# Patient Record
Sex: Female | Born: 1965 | Race: White | Hispanic: No | Smoking: Never smoker
Health system: Southern US, Community
[De-identification: ages and names within clinical notes are randomized; demographics above are authoritative.]

## PROBLEM LIST (undated history)

## (undated) DIAGNOSIS — I739 Peripheral vascular disease, unspecified: Secondary | ICD-10-CM

## (undated) DIAGNOSIS — I8390 Asymptomatic varicose veins of unspecified lower extremity: Secondary | ICD-10-CM

## (undated) HISTORY — PX: HERNIA REPAIR: SHX51

## (undated) HISTORY — PX: VASCULAR SURGERY: SHX849

## (undated) HISTORY — PX: TUBAL LIGATION: SHX77

## (undated) HISTORY — PX: BLADDER SURGERY: SHX569

## (undated) HISTORY — PX: DILATION AND CURETTAGE OF UTERUS: SHX78

## (undated) HISTORY — PX: TONSILLECTOMY: SUR1361

---

## 2019-01-27 ENCOUNTER — Other Ambulatory Visit (HOSPITAL_COMMUNITY): Payer: Self-pay | Admitting: Orthopedic Surgery

## 2019-02-06 ENCOUNTER — Other Ambulatory Visit: Payer: Self-pay

## 2019-02-06 ENCOUNTER — Encounter (HOSPITAL_BASED_OUTPATIENT_CLINIC_OR_DEPARTMENT_OTHER): Payer: Self-pay | Admitting: *Deleted

## 2019-02-10 ENCOUNTER — Other Ambulatory Visit (HOSPITAL_COMMUNITY): Admission: RE | Admit: 2019-02-10 | Payer: PRIVATE HEALTH INSURANCE | Source: Ambulatory Visit

## 2019-02-11 ENCOUNTER — Other Ambulatory Visit: Payer: Self-pay

## 2019-02-11 ENCOUNTER — Other Ambulatory Visit (HOSPITAL_COMMUNITY)
Admission: RE | Admit: 2019-02-11 | Discharge: 2019-02-11 | Disposition: A | Discharge status: Home / Self Care | Payer: No Typology Code available for payment source | Source: Ambulatory Visit | Attending: Orthopedic Surgery | Admitting: Orthopedic Surgery

## 2019-02-11 DIAGNOSIS — Z1159 Encounter for screening for other viral diseases: Secondary | ICD-10-CM | POA: Diagnosis present

## 2019-02-11 LAB — SARS CORONAVIRUS 2 BY RT PCR (HOSPITAL ORDER, PERFORMED IN ~~LOC~~ HOSPITAL LAB): SARS Coronavirus 2: NEGATIVE

## 2019-02-11 NOTE — Progress Notes (Signed)
Ensure pre surgery drink given with instructions to complete by 0530 dos, surgical soap given with instructions, pt verbalized understanding. 

## 2019-02-13 ENCOUNTER — Encounter (HOSPITAL_BASED_OUTPATIENT_CLINIC_OR_DEPARTMENT_OTHER)
Admission: RE | Disposition: A | Discharge status: Home / Self Care | Payer: Self-pay | Source: Home / Self Care | Attending: Orthopedic Surgery

## 2019-02-13 ENCOUNTER — Encounter (HOSPITAL_BASED_OUTPATIENT_CLINIC_OR_DEPARTMENT_OTHER): Payer: Self-pay

## 2019-02-13 ENCOUNTER — Other Ambulatory Visit: Payer: Self-pay

## 2019-02-13 ENCOUNTER — Ambulatory Visit (HOSPITAL_BASED_OUTPATIENT_CLINIC_OR_DEPARTMENT_OTHER): Payer: No Typology Code available for payment source | Admitting: Certified Registered"

## 2019-02-13 ENCOUNTER — Ambulatory Visit (HOSPITAL_COMMUNITY): Payer: No Typology Code available for payment source

## 2019-02-13 ENCOUNTER — Ambulatory Visit (HOSPITAL_BASED_OUTPATIENT_CLINIC_OR_DEPARTMENT_OTHER)
Admission: RE | Admit: 2019-02-13 | Discharge: 2019-02-13 | Disposition: A | Discharge status: Home / Self Care | Payer: No Typology Code available for payment source | Attending: Orthopedic Surgery | Admitting: Orthopedic Surgery

## 2019-02-13 DIAGNOSIS — I739 Peripheral vascular disease, unspecified: Secondary | ICD-10-CM | POA: Insufficient documentation

## 2019-02-13 DIAGNOSIS — M21612 Bunion of left foot: Secondary | ICD-10-CM | POA: Insufficient documentation

## 2019-02-13 DIAGNOSIS — Z1159 Encounter for screening for other viral diseases: Secondary | ICD-10-CM | POA: Insufficient documentation

## 2019-02-13 DIAGNOSIS — M71372 Other bursal cyst, left ankle and foot: Secondary | ICD-10-CM | POA: Insufficient documentation

## 2019-02-13 DIAGNOSIS — M67472 Ganglion, left ankle and foot: Secondary | ICD-10-CM | POA: Insufficient documentation

## 2019-02-13 HISTORY — PX: TARSAL METATARSAL ARTHRODESIS: SHX2481

## 2019-02-13 HISTORY — PX: BUNIONECTOMY: SHX129

## 2019-02-13 HISTORY — DX: Asymptomatic varicose veins of unspecified lower extremity: I83.90

## 2019-02-13 HISTORY — DX: Peripheral vascular disease, unspecified: I73.9

## 2019-02-13 LAB — SARS CORONAVIRUS 2 BY RT PCR (HOSPITAL ORDER, PERFORMED IN ~~LOC~~ HOSPITAL LAB): SARS Coronavirus 2: NEGATIVE

## 2019-02-13 SURGERY — BUNIONECTOMY
Anesthesia: General | Site: Foot | Laterality: Left

## 2019-02-13 MED ORDER — FENTANYL CITRATE (PF) 100 MCG/2ML IJ SOLN
INTRAMUSCULAR | Status: AC
  Filled 2019-02-13: qty 2

## 2019-02-13 MED ORDER — MIDAZOLAM HCL 2 MG/2ML IJ SOLN
INTRAMUSCULAR | Status: AC
  Filled 2019-02-13: qty 2

## 2019-02-13 MED ORDER — CHLORHEXIDINE GLUCONATE 4 % EX LIQD: 60.0000 mL | Freq: Once | CUTANEOUS | Status: DC

## 2019-02-13 MED ORDER — LACTATED RINGERS IV SOLN
INTRAVENOUS | Status: DC
  Administered 2019-02-13: 11:00:00 via INTRAVENOUS

## 2019-02-13 MED ORDER — FENTANYL CITRATE (PF) 100 MCG/2ML IJ SOLN
50.0000 ug | INTRAMUSCULAR | Status: DC | PRN
  Administered 2019-02-13: 100 ug via INTRAVENOUS

## 2019-02-13 MED ORDER — MIDAZOLAM HCL 2 MG/2ML IJ SOLN
1.0000 mg | INTRAMUSCULAR | Status: DC | PRN
  Administered 2019-02-13: 2 mg via INTRAVENOUS

## 2019-02-13 MED ORDER — DEXAMETHASONE SODIUM PHOSPHATE 10 MG/ML IJ SOLN
INTRAMUSCULAR | Status: DC | PRN
  Administered 2019-02-13: 10 mg via INTRAVENOUS

## 2019-02-13 MED ORDER — PROPOFOL 10 MG/ML IV BOLUS
INTRAVENOUS | Status: DC | PRN
  Administered 2019-02-13: 180 mg via INTRAVENOUS

## 2019-02-13 MED ORDER — BUPIVACAINE-EPINEPHRINE (PF) 0.5% -1:200000 IJ SOLN
INTRAMUSCULAR | Status: AC
  Filled 2019-02-13: qty 30

## 2019-02-13 MED ORDER — CEFAZOLIN SODIUM-DEXTROSE 2-4 GM/100ML-% IV SOLN
2.0000 g | INTRAVENOUS | Status: AC
  Administered 2019-02-13: 2 g via INTRAVENOUS

## 2019-02-13 MED ORDER — 0.9 % SODIUM CHLORIDE (POUR BTL) OPTIME
TOPICAL | Status: DC | PRN
  Administered 2019-02-13: 150 mL

## 2019-02-13 MED ORDER — OXYCODONE HCL 5 MG PO TABS: 5.0000 mg | ORAL_TABLET | Freq: Four times a day (QID) | ORAL | 0 refills | Status: AC | PRN

## 2019-02-13 MED ORDER — ROPIVACAINE HCL 5 MG/ML IJ SOLN
INTRAMUSCULAR | Status: DC | PRN
  Administered 2019-02-13: 30 mL via PERINEURAL

## 2019-02-13 MED ORDER — HYDROMORPHONE HCL 1 MG/ML IJ SOLN: 0.2500 mg | INTRAMUSCULAR | Status: DC | PRN

## 2019-02-13 MED ORDER — ASPIRIN EC 81 MG PO TBEC: 81.0000 mg | DELAYED_RELEASE_TABLET | Freq: Two times a day (BID) | ORAL | 0 refills | Status: AC

## 2019-02-13 MED ORDER — ONDANSETRON HCL 4 MG/2ML IJ SOLN
INTRAMUSCULAR | Status: DC | PRN
  Administered 2019-02-13: 4 mg via INTRAVENOUS

## 2019-02-13 MED ORDER — PROMETHAZINE HCL 25 MG/ML IJ SOLN: 6.2500 mg | INTRAMUSCULAR | Status: DC | PRN

## 2019-02-13 MED ORDER — MEPERIDINE HCL 25 MG/ML IJ SOLN: 6.2500 mg | INTRAMUSCULAR | Status: DC | PRN

## 2019-02-13 MED ORDER — SCOPOLAMINE 1 MG/3DAYS TD PT72: 1.0000 | MEDICATED_PATCH | Freq: Once | TRANSDERMAL | Status: DC | PRN

## 2019-02-13 MED ORDER — OXYCODONE HCL 5 MG/5ML PO SOLN: 5.0000 mg | Freq: Once | ORAL | Status: DC | PRN

## 2019-02-13 MED ORDER — OXYCODONE HCL 5 MG PO TABS: 5.0000 mg | ORAL_TABLET | Freq: Once | ORAL | Status: DC | PRN

## 2019-02-13 MED ORDER — SODIUM CHLORIDE 0.9 % IV SOLN: INTRAVENOUS | Status: DC

## 2019-02-13 MED ORDER — NAPROXEN SODIUM 220 MG PO TABS: 440.0000 mg | ORAL_TABLET | Freq: Two times a day (BID) | ORAL | Status: AC

## 2019-02-13 MED ORDER — PROPOFOL 500 MG/50ML IV EMUL
INTRAVENOUS | Status: DC | PRN
  Administered 2019-02-13: 25 ug/kg/min via INTRAVENOUS

## 2019-02-13 MED ORDER — LIDOCAINE 2% (20 MG/ML) 5 ML SYRINGE
INTRAMUSCULAR | Status: DC | PRN
  Administered 2019-02-13: 30 mg via INTRAVENOUS

## 2019-02-13 MED ORDER — CEFAZOLIN SODIUM-DEXTROSE 2-4 GM/100ML-% IV SOLN
INTRAVENOUS | Status: AC
  Filled 2019-02-13: qty 100

## 2019-02-13 SURGICAL SUPPLY — 78 items
BANDAGE ESMARK 6X9 LF (GAUZE/BANDAGES/DRESSINGS) IMPLANT
BIT DRILL 2.4 AO COUPLING CANN (BIT) ×2 IMPLANT
BLADE AVERAGE 25X9 (BLADE) IMPLANT
BLADE LONG MED 25X9 (BLADE) IMPLANT
BLADE MICRO SAGITTAL (BLADE) ×2 IMPLANT
BLADE MINI RND TIP GREEN BEAV (BLADE) IMPLANT
BLADE OSC/SAG .038X5.5 CUT EDG (BLADE) IMPLANT
BLADE SURG 15 STRL LF DISP TIS (BLADE) ×3 IMPLANT
BLADE SURG 15 STRL SS (BLADE) ×3
BNDG COHESIVE 4X5 TAN STRL (GAUZE/BANDAGES/DRESSINGS) ×2 IMPLANT
BNDG COHESIVE 6X5 TAN STRL LF (GAUZE/BANDAGES/DRESSINGS) ×2 IMPLANT
BNDG CONFORM 2 STRL LF (GAUZE/BANDAGES/DRESSINGS) IMPLANT
BNDG CONFORM 3 STRL LF (GAUZE/BANDAGES/DRESSINGS) ×2 IMPLANT
BNDG ESMARK 6X9 LF (GAUZE/BANDAGES/DRESSINGS)
CHLORAPREP W/TINT 26 (MISCELLANEOUS) ×2 IMPLANT
COVER BACK TABLE REUSABLE LG (DRAPES) ×2 IMPLANT
COVER WAND RF STERILE (DRAPES) IMPLANT
CUFF TOURN SGL QUICK 34 (TOURNIQUET CUFF) ×1
CUFF TRNQT CYL 34X4.125X (TOURNIQUET CUFF) ×1 IMPLANT
DECANTER SPIKE VIAL GLASS SM (MISCELLANEOUS) IMPLANT
DRAPE EXTREMITY T 121X128X90 (DISPOSABLE) ×2 IMPLANT
DRAPE OEC MINIVIEW 54X84 (DRAPES) ×2 IMPLANT
DRAPE U-SHAPE 47X51 STRL (DRAPES) ×2 IMPLANT
DRSG MEPITEL 4X7.2 (GAUZE/BANDAGES/DRESSINGS) ×2 IMPLANT
DRSG PAD ABDOMINAL 8X10 ST (GAUZE/BANDAGES/DRESSINGS) ×4 IMPLANT
ELECT REM PT RETURN 9FT ADLT (ELECTROSURGICAL) ×2
ELECTRODE REM PT RTRN 9FT ADLT (ELECTROSURGICAL) ×1 IMPLANT
GAUZE SPONGE 4X4 12PLY STRL (GAUZE/BANDAGES/DRESSINGS) ×2 IMPLANT
GLOVE BIO SURGEON STRL SZ7 (GLOVE) ×4 IMPLANT
GLOVE BIO SURGEON STRL SZ8 (GLOVE) ×2 IMPLANT
GLOVE BIOGEL PI IND STRL 7.0 (GLOVE) ×2 IMPLANT
GLOVE BIOGEL PI IND STRL 7.5 (GLOVE) ×1 IMPLANT
GLOVE BIOGEL PI IND STRL 8 (GLOVE) ×1 IMPLANT
GLOVE BIOGEL PI INDICATOR 7.0 (GLOVE) ×2
GLOVE BIOGEL PI INDICATOR 7.5 (GLOVE) ×1
GLOVE BIOGEL PI INDICATOR 8 (GLOVE) ×1
GLOVE ECLIPSE 8.0 STRL XLNG CF (GLOVE) IMPLANT
GOWN STRL REUS W/ TWL LRG LVL3 (GOWN DISPOSABLE) ×1 IMPLANT
GOWN STRL REUS W/ TWL XL LVL3 (GOWN DISPOSABLE) ×2 IMPLANT
GOWN STRL REUS W/TWL LRG LVL3 (GOWN DISPOSABLE) ×1
GOWN STRL REUS W/TWL XL LVL3 (GOWN DISPOSABLE) ×2
K-WIRE DBL END .054 LG (WIRE) ×2 IMPLANT
K-WIRE TROC 1.25X150 (WIRE) ×2
KWIRE TROC 1.25X150 (WIRE) ×1 IMPLANT
NEEDLE HYPO 22GX1.5 SAFETY (NEEDLE) IMPLANT
NEEDLE HYPO 25X1 1.5 SAFETY (NEEDLE) IMPLANT
NS IRRIG 1000ML POUR BTL (IV SOLUTION) ×2 IMPLANT
PACK BASIN DAY SURGERY FS (CUSTOM PROCEDURE TRAY) ×2 IMPLANT
PAD CAST 4YDX4 CTTN HI CHSV (CAST SUPPLIES) ×1 IMPLANT
PADDING CAST ABS 4INX4YD NS (CAST SUPPLIES)
PADDING CAST ABS COTTON 4X4 ST (CAST SUPPLIES) IMPLANT
PADDING CAST COTTON 4X4 STRL (CAST SUPPLIES) ×1
PADDING CAST COTTON 6X4 STRL (CAST SUPPLIES) ×2 IMPLANT
PENCIL BUTTON HOLSTER BLD 10FT (ELECTRODE) ×2 IMPLANT
SANITIZER HAND PURELL 535ML FO (MISCELLANEOUS) ×2 IMPLANT
SCREW CANN PT 4.0X34 (Screw) ×2 IMPLANT
SCREW NL LP 36X3.5 (Screw) ×2 IMPLANT
SHEET MEDIUM DRAPE 40X70 STRL (DRAPES) ×2 IMPLANT
SLEEVE SCD COMPRESS KNEE MED (MISCELLANEOUS) ×2 IMPLANT
SPLINT FAST PLASTER 5X30 (CAST SUPPLIES) ×20
SPLINT PLASTER CAST FAST 5X30 (CAST SUPPLIES) ×20 IMPLANT
SPONGE LAP 18X18 RF (DISPOSABLE) ×2 IMPLANT
STOCKINETTE 6  STRL (DRAPES) ×1
STOCKINETTE 6 STRL (DRAPES) ×1 IMPLANT
SUCTION FRAZIER HANDLE 10FR (MISCELLANEOUS) ×1
SUCTION TUBE FRAZIER 10FR DISP (MISCELLANEOUS) ×1 IMPLANT
SUT ETHILON 3 0 PS 1 (SUTURE) ×4 IMPLANT
SUT MNCRL AB 3-0 PS2 18 (SUTURE) ×2 IMPLANT
SUT VIC AB 0 SH 27 (SUTURE) IMPLANT
SUT VIC AB 2-0 SH 27 (SUTURE) ×1
SUT VIC AB 2-0 SH 27XBRD (SUTURE) ×1 IMPLANT
SUT VICRYL 0 UR6 27IN ABS (SUTURE) IMPLANT
SYR BULB 3OZ (MISCELLANEOUS) ×2 IMPLANT
SYR CONTROL 10ML LL (SYRINGE) IMPLANT
TOWEL GREEN STERILE FF (TOWEL DISPOSABLE) ×4 IMPLANT
TUBE CONNECTING 20X1/4 (TUBING) ×2 IMPLANT
UNDERPAD 30X30 (UNDERPADS AND DIAPERS) ×2 IMPLANT
YANKAUER SUCT BULB TIP NO VENT (SUCTIONS) IMPLANT

## 2019-02-13 NOTE — Anesthesia Procedure Notes (Signed)
Procedure Name: LMA Insertion Date/Time: 02/13/2019 1:18 PM Performed by: Gar Gibbon, CRNA Pre-anesthesia Checklist: Patient identified, Emergency Drugs available, Suction available and Patient being monitored Patient Re-evaluated:Patient Re-evaluated prior to induction Oxygen Delivery Method: Circle system utilized Preoxygenation: Pre-oxygenation with 100% oxygen Induction Type: IV induction Ventilation: Mask ventilation without difficulty LMA: LMA inserted LMA Size: 4.0 Number of attempts: 1 Airway Equipment and Method: Bite block Placement Confirmation: positive ETCO2 Tube secured with: Tape Dental Injury: Teeth and Oropharynx as per pre-operative assessment

## 2019-02-13 NOTE — Progress Notes (Signed)
Assisted Dr. Miller with left, ultrasound guided, popliteal block. Side rails up, monitors on throughout procedure. See vital signs in flow sheet. Tolerated Procedure well. 

## 2019-02-13 NOTE — Anesthesia Preprocedure Evaluation (Signed)
Anesthesia Evaluation  Patient identified by MRN, date of birth, ID band Patient awake    Reviewed: Allergy & Precautions, NPO status , Patient's Chart, lab work & pertinent test results  Airway Mallampati: II  TM Distance: >3 FB Neck ROM: Full    Dental no notable dental hx.    Pulmonary neg pulmonary ROS,    Pulmonary exam normal breath sounds clear to auscultation       Cardiovascular negative cardio ROS Normal cardiovascular exam Rhythm:Regular Rate:Normal     Neuro/Psych negative neurological ROS  negative psych ROS   GI/Hepatic negative GI ROS, Neg liver ROS,   Endo/Other  negative endocrine ROS  Renal/GU negative Renal ROS  negative genitourinary   Musculoskeletal negative musculoskeletal ROS (+)   Abdominal (+) + obese,   Peds negative pediatric ROS (+)  Hematology negative hematology ROS (+)   Anesthesia Other Findings   Reproductive/Obstetrics negative OB ROS                             Anesthesia Physical Anesthesia Plan  ASA: II  Anesthesia Plan: General   Post-op Pain Management:  Regional for Post-op pain   Induction: Intravenous  PONV Risk Score and Plan: 3 and Ondansetron, Dexamethasone, Midazolam and Treatment may vary due to age or medical condition  Airway Management Planned: LMA  Additional Equipment:   Intra-op Plan:   Post-operative Plan: Extubation in OR  Informed Consent: I have reviewed the patients History and Physical, chart, labs and discussed the procedure including the risks, benefits and alternatives for the proposed anesthesia with the patient or authorized representative who has indicated his/her understanding and acceptance.     Dental advisory given  Plan Discussed with: CRNA  Anesthesia Plan Comments:         Anesthesia Quick Evaluation  

## 2019-02-13 NOTE — Progress Notes (Signed)
Patient states she was not able to self quarantine after her Covid test on 02/11/2019, and states she went to work yesterday. Dr Hyacinth Meeker and Dr Victorino Dike were notified. Sent patient to [facility] testing center for a rapid screening in order to perform surgery later today.

## 2019-02-13 NOTE — Op Note (Signed)
02/13/2019  2:34 PM  PATIENT:  Alexis Burgess  53 y.o. female  PRE-OPERATIVE DIAGNOSIS: 1.  Left foot painful bunion      2.  Left foot ganglion cyst  POST-OPERATIVE DIAGNOSIS: same  Procedure(s): 1.  Excision of left forefoot ganglion cyst 2.  Left modified McBride bunionectomy 3.  Left first tarsometatarsal joint Lapidus arthrodesis 4.  Left foot AP, lateral and oblique radiographs  SURGEON:  Toni Arthurs, MD  ASSISTANT: None  ANESTHESIA:   General, regional  EBL:  minimal   TOURNIQUET:   Total Tourniquet Time Documented: Thigh (Left) - 53 minutes Total: Thigh (Left) - 53 minutes  COMPLICATIONS:  None apparent  DISPOSITION:  Extubated, awake and stable to recovery.  INDICATION FOR PROCEDURE: The patient is a 53 year old female without significant past medical history.  She has a painful left foot bunion deformity that has been bothering her for many months.  She also appears to have a small ganglion cyst at the dorsal medial aspect of the first MTP joint.  She has failed nonoperative treatment to date including activity modification, oral anti-inflammatories and shoewear modification.  She presents now for operative treatment of these painful and limiting left forefoot conditions.  The risks and benefits of the alternative treatment options have been discussed in detail.  The patient wishes to proceed with surgery and specifically understands risks of bleeding, infection, nerve damage, blood clots, need for additional surgery, amputation and death.  PROCEDURE IN DETAIL:  After pre operative consent was obtained, and the correct operative site was identified, the patient was brought to the operating room and placed supine on the OR table.  Anesthesia was administered.  Pre-operative antibiotics were administered.  A surgical timeout was taken.  The left lower extremity was prepped and draped in standard sterile fashion with a tourniquet around the thigh.  The extremity was elevated and  the tourniquet was inflated to 250 mmHg.  A longitudinal incision was made the dorsum of the first webspace.  Dissection was carried down through the subcutaneous tissues.  The intermetatarsal ligament was divided under direct vision.  An arthrotomy was then made between the lateral sesamoid and the first metatarsal head.    Attention was turned to the medial forefoot where a longitudinal incision was made over the medial eminence.  Dissection was carried down through the subcutaneous tissues.  The joint capsule was in an elevated dorsally and plantarly exposing the hypertrophic medial eminence.  This was resected in line with the first metatarsal shaft.  After resection of the medial eminence there was still a prominent area of soft tissue swelling dorsal to the incision.  Subcutaneous dissection was then carried over to this mass within the joint capsule.  This was bluntly dissected from the surrounding soft tissues and did not appear to be adherent to any adjacent tissue.  The mass was shelled out and passed off the field as a specimen to pathology.  Attention was turned to the dorsal aspect of the midfoot.  A longitudinal incision was made over the first tarsometatarsal joint.  Dissection was carried down through the subcutaneous tissues.  The extensor houses longus tendon was identified.  It was retracted and protected throughout the case.  The first TMT joint was exposed.  The joint capsule was incised and elevated medially and laterally.  The oscillating saw was then used to resect the articular cartilage and subchondral bone from both sides of the joint.  More bone was taken laterally than medially in order to correct  the intermetatarsal angle.  The wound was then irrigated copiously.  The first TMT joint was reduced and provisionally pinned.  AP and lateral radiographs confirmed appropriate correction of the bunion deformity.  The K wire was overdrilled and a 4 mm partially-threaded cannulated screw  from the Biomet 4 mm 5 mm cannulated screw set was inserted.  It was noted to have excellent purchase and compressed the arthrodesis site appropriately.  The K wire was then inserted from distal to proximal across the joint.  Radiographs confirmed appropriate alignment of the wire.  It was overdrilled and removed.  A 4 mm solid LPS screw was inserted and noted to have excellent purchase.  AP, oblique and lateral radiographs confirmed appropriate position and length of all hardware and appropriate correction of the bunion deformity.  The wounds were irrigated copiously.  The redundant medial joint capsule was excised.  The medial joint capsule was then repaired with figure-of-eight sutures of 2-0 Vicryl.  Subcutaneous tissues were approximated with 2-0 Vicryl.  The skin incisions were closed with nylon.  Sterile dressings were applied followed by a bunion wrap and a short leg splint.  The tourniquet was released after application of the dressings.  The patient was awakened from anesthesia and transported to the recovery room in stable condition.   FOLLOW UP PLAN: Nonweightbearing on the left lower extremity.  Follow-up in the office in 2 weeks for suture removal and conversion to a short leg cast and a toe spacer.   RADIOGRAPHS: AP, oblique and lateral radiographs of the left foot are obtained intraoperatively.  These show interval arthrodesis of the first TMT joint with appropriate correction of the bunion deformity.  No other acute injuries are noted.

## 2019-02-13 NOTE — Anesthesia Procedure Notes (Signed)
Anesthesia Regional Block: Popliteal block   Pre-Anesthetic Checklist: ,, timeout performed, Correct Patient, Correct Site, Correct Laterality, Correct Procedure, Correct Position, site marked, Risks and benefits discussed,  Surgical consent,  Pre-op evaluation,  At surgeon's request and post-op pain management  Laterality: Left  Prep: chloraprep       Needles:  Injection technique: Single-shot  Needle Type: Stimiplex     Needle Length: 9cm  Needle Gauge: 21     Additional Needles:   Procedures:,,,, ultrasound used (permanent image in chart),,,,  Narrative:  Start time: 02/13/2019 11:09 AM End time: 02/13/2019 11:14 AM Injection made incrementally with aspirations every 5 mL.  Performed by: Personally  Anesthesiologist: Lowella Curb, MD

## 2019-02-13 NOTE — Anesthesia Postprocedure Evaluation (Signed)
Anesthesia Post Note  Patient: Alexis Burgess  Procedure(s) Performed: Excision of Left Foot Ganglion Cyst; Modified McBride Bunionectomy (Left Foot) Left First Tarsometatarsal Lapidus Arthrodesis (Left Foot)     Patient location during evaluation: PACU Anesthesia Type: General Level of consciousness: awake and alert Pain management: pain level controlled Vital Signs Assessment: post-procedure vital signs reviewed and stable Respiratory status: spontaneous breathing, nonlabored ventilation, respiratory function stable and patient connected to nasal cannula oxygen Cardiovascular status: blood pressure returned to baseline and stable Postop Assessment: no apparent nausea or vomiting Anesthetic complications: no    Last Vitals:  Vitals:   02/13/19 1445 02/13/19 1500  BP: 108/73 108/77  Pulse: 78 82  Resp: 17 16  Temp:    SpO2: 100% 99%    Last Pain:  Vitals:   02/13/19 1445  TempSrc:   PainSc: 0-No pain                 Dorman Calderwood S

## 2019-02-13 NOTE — Transfer of Care (Signed)
Immediate Anesthesia Transfer of Care Note  Patient: Alexis Burgess  Procedure(s) Performed: Excision of Left Foot Ganglion Cyst; Modified McBride Bunionectomy (Left Foot) Left First Tarsometatarsal Lapidus Arthrodesis (Left Foot)  Patient Location: PACU  Anesthesia Type:GA combined with regional for post-op pain  Level of Consciousness: awake, sedated and patient cooperative  Airway & Oxygen Therapy: Patient Spontanous Breathing and Patient connected to nasal cannula oxygen  Post-op Assessment: Report given to RN and Post -op Vital signs reviewed and stable  Post vital signs: Reviewed and stable  Last Vitals:  Vitals Value Taken Time  BP 111/84 02/13/2019  2:36 PM  Temp    Pulse 94 02/13/2019  2:39 PM  Resp 19 02/13/2019  2:39 PM  SpO2 99 % 02/13/2019  2:39 PM  Vitals shown include unvalidated device data.  Last Pain:  Vitals:   02/13/19 1032  TempSrc: Oral  PainSc: 0-No pain         Complications: No apparent anesthesia complications

## 2019-02-13 NOTE — H&P (Signed)
Alexis NatterLisa Burgess is an 53 y.o. female.   Chief Complaint: left foot pain HPI: 53 y/o female without signfiicant PMH c/o L forefoot pain for many months.  No relief with shoewear and activity modification.  She feels better with rest and elevation.  No h/o injury or surgery to the left foot.  She has failed non op treatmnet and presents today for bunion correction and exicison of ganglion cyst.  Past Medical History:  Diagnosis Date  . Peripheral vascular disease (HCC)   . Varicose vein of leg     Past Surgical History:  Procedure Laterality Date  . BLADDER SURGERY    . CESAREAN SECTION    . DILATION AND CURETTAGE OF UTERUS    . HERNIA REPAIR     times three  . TONSILLECTOMY    . TUBAL LIGATION    . VASCULAR SURGERY     lower extremities    Family History  Problem Relation Age of Onset  . Heart disease Mother   . Atrial fibrillation Mother   . Heart disease Father    Social History:  reports that she has never smoked. She has never used smokeless tobacco. She reports that she does not drink alcohol or use drugs.  Allergies: No Known Allergies  Medications Prior to Admission  Medication Sig Dispense Refill  . cholecalciferol (VITAMIN D3) 25 MCG (1000 UT) tablet Take 2,000 Units by mouth daily.    Marland Kitchen. KRILL OIL PO Take by mouth.    . Multiple Vitamins-Minerals (MULTIVITAMIN WITH MINERALS) tablet Take 1 tablet by mouth daily.    . naproxen (NAPROSYN) 250 MG tablet Take 200 mg by mouth 2 (two) times daily with a meal.    . SUPER B COMPLEX/C PO Take by mouth.    . furosemide (LASIX) 20 MG tablet Take 20 mg by mouth as needed for edema.      Results for orders placed or performed during the hospital encounter of 02/13/19 (from the past 48 hour(s))  SARS Coronavirus 2 (CEPHEID - [facility] Hosp Order     Status: None   Collection Time: 02/13/19  8:37 AM  Result Value Ref Range   SARS Coronavirus 2 NEGATIVE NEGATIVE    Comment: (NOTE) If result is  NEGATIVE SARS-CoV-2 target nucleic acids are NOT DETECTED. The SARS-CoV-2 RNA is generally detectable in upper and lower  respiratory specimens during the acute phase of infection. The lowest  concentration of SARS-CoV-2 viral copies this assay can detect is 250  copies / mL. A negative result does not preclude SARS-CoV-2 infection  and should not be used as the sole basis for treatment or other  patient management decisions.  A negative result may occur with  improper specimen collection / handling, submission of specimen other  than nasopharyngeal swab, presence of viral mutation(s) within the  areas targeted by this assay, and inadequate number of viral copies  (<250 copies / mL). A negative result must be combined with clinical  observations, patient history, and epidemiological information. If result is POSITIVE SARS-CoV-2 target nucleic acids are DETECTED. The SARS-CoV-2 RNA is generally detectable in upper and lower  respiratory specimens dur ing the acute phase of infection.  Positive  results are indicative of active infection with SARS-CoV-2.  Clinical  correlation with patient history and other diagnostic information is  necessary to determine patient infection status.  Positive results do  not rule out bacterial infection or co-infection with other viruses. If result is PRESUMPTIVE POSTIVE  SARS-CoV-2 nucleic acids MAY BE PRESENT.   A presumptive positive result was obtained on the submitted specimen  and confirmed on repeat testing.  While 2019 novel coronavirus  (SARS-CoV-2) nucleic acids may be present in the submitted sample  additional confirmatory testing may be necessary for epidemiological  and / or clinical management purposes  to differentiate between  SARS-CoV-2 and other Sarbecovirus currently known to infect humans.  If clinically indicated additional testing with an alternate test  methodology [phone]) is advised. The SARS-CoV-2 RNA is generally  detectable  in upper and lower respiratory sp ecimens during the acute  phase of infection. The expected result is Negative. Fact Sheet for Patients:  BoilerBrush.com.cy Fact Sheet for Healthcare Providers: https://pope.com/ This test is not yet approved or cleared by the [place] FDA and has been authorized for detection and/or diagnosis of SARS-CoV-2 by FDA under an Emergency Use Authorization (EUA).  This EUA will remain in effect (meaning this test can be used) for the duration of the COVID-19 declaration under Section 564(b)(1) of the Act, 21 U.S.C. section 360bbb-3(b)(1), unless the authorization is terminated or revoked sooner. Performed at [facility], [address], [place], [place] [address]    No results found.  ROS  No recent f/c/n/v/w tloss.  Blood pressure 104/66, pulse 85, temperature 98.1 F (36.7 C), temperature source Oral, resp. rate 17, height 5\' 6"  (1.676 m), weight 90 kg, SpO2 100 %. Physical Exam  wn wd woman in nad.  A and o x 4.  Mood and affect normal.  EOMI.  resp unlabored.  L foot with prominent bunion deformity.  Skin healthy and intact.  No lymphadenopathy.  5/5 strength in PF and DF of the toes.  Sens to LT intact at the forefoot   Assessment/Plan L forefoot ganglion cyst and bunion deformity.  To OR today for surgical correction of the bunion and excision of the ganglion cyst.  The risks and benefits of the alternative treatment options have been discussed in detail.  The patient wishes to proceed with surgery and specifically understands risks of bleeding, infection, nerve damage, blood clots, need for additional surgery, amputation and death.   Toni Arthurs, MD 02/23/19, 12:44 PM

## 2019-02-13 NOTE — Discharge Instructions (Addendum)
John Hewitt, MD [facility]  Please read the following information regarding your care after surgery.  Medications  You only need a prescription for the narcotic pain medicine (ex. oxycodone, Percocet, Norco).  All of the other medicines listed below are available over the counter. X Aleve 2 pills twice a day for the first 3 days after surgery. X acetominophen (Tylenol) 650 mg every 4-6 hours as you need for minor to moderate pain X oxycodone as prescribed for severe pain  Narcotic pain medicine (ex. oxycodone, Percocet, Vicodin) will cause constipation.  To prevent this problem, take the following medicines while you are taking any pain medicine. X docusate sodium (Colace) 100 mg twice a day X senna (Senokot) 2 tablets twice a day  X To help prevent blood clots, take a baby aspirin (81 mg) twice a day for two weeks after surgery.  You should also get up every hour while you are awake to move around.    Weight Bearing ? Bear weight when you are able on your operated leg or foot. ? Bear weight only on your operated foot in the post-op shoe. X Do not bear any weight on the operated leg or foot.  Cast / Splint / Dressing X Keep your splint, cast or dressing clean and dry.  Don't put anything (coat hanger, pencil, etc) down inside of it.  If it gets damp, use a hair dryer on the cool setting to dry it.  If it gets soaked, call the office to schedule an appointment for a cast change. ? Remove your dressing 3 days after surgery and cover the incisions with dry dressings.    After your dressing, cast or splint is removed; you may shower, but do not soak or scrub the wound.  Allow the water to run over it, and then gently pat it dry.  Swelling It is normal for you to have swelling where you had surgery.  To reduce swelling and pain, keep your toes above your nose for at least 3 days after surgery.  It may be necessary to keep your foot or leg elevated for several weeks.  If it hurts, it should be  elevated.  Follow Up Call my office at [phone] when you are discharged from the hospital or surgery center to schedule an appointment to be seen two weeks after surgery.  Call my office at [phone] if you develop a fever >101.5 F, nausea, vomiting, bleeding from the surgical site or severe pain.     Post Anesthesia Home Care Instructions  Activity: Get plenty of rest for the remainder of the day. A responsible individual must stay with you for 24 hours following the procedure.  For the next 24 hours, DO NOT: -Drive a car -Operate machinery -Drink alcoholic beverages -Take any medication unless instructed by your physician -Make any legal decisions or sign important papers.  Meals: Start with liquid foods such as gelatin or soup. Progress to regular foods as tolerated. Avoid greasy, spicy, heavy foods. If nausea and/or vomiting occur, drink only clear liquids until the nausea and/or vomiting subsides. Call your physician if vomiting continues.  Special Instructions/Symptoms: Your throat may feel dry or sore from the anesthesia or the breathing tube placed in your throat during surgery. If this causes discomfort, gargle with warm salt water. The discomfort should disappear within 24 hours.  If you had a scopolamine patch placed behind your ear for the management of post- operative nausea and/or vomiting:  1. The medication in the patch is   effective for 72 hours, after which it should be removed.  Wrap patch in a tissue and discard in the trash. Wash hands thoroughly with soap and water. 2. You may remove the patch earlier than 72 hours if you experience unpleasant side effects which may include dry mouth, dizziness or visual disturbances. 3. Avoid touching the patch. Wash your hands with soap and water after contact with the patch.    Regional Anesthesia Blocks  1. Numbness or the inability to move the "blocked" extremity may last from 3-48 hours after placement. The length  of time depends on the medication injected and your individual response to the medication. If the numbness is not going away after 48 hours, call your surgeon.  2. The extremity that is blocked will need to be protected until the numbness is gone and the  Strength has returned. Because you cannot feel it, you will need to take extra care to avoid injury. Because it may be weak, you may have difficulty moving it or using it. You may not know what position it is in without looking at it while the block is in effect.  3. For blocks in the legs and feet, returning to weight bearing and walking needs to be done carefully. You will need to wait until the numbness is entirely gone and the strength has returned. You should be able to move your leg and foot normally before you try and bear weight or walk. You will need someone to be with you when you first try to ensure you do not fall and possibly risk injury.  4. Bruising and tenderness at the needle site are common side effects and will resolve in a few days.  5. Persistent numbness or new problems with movement should be communicated to the surgeon or the [facility] ([phone])/ [facility] ([phone]). 

## 2019-02-14 ENCOUNTER — Encounter (HOSPITAL_BASED_OUTPATIENT_CLINIC_OR_DEPARTMENT_OTHER): Payer: Self-pay | Admitting: Orthopedic Surgery

## 2019-11-03 ENCOUNTER — Other Ambulatory Visit: Payer: Self-pay | Admitting: Orthopedic Surgery

## 2019-11-03 DIAGNOSIS — M79672 Pain in left foot: Secondary | ICD-10-CM

## 2019-11-07 ENCOUNTER — Ambulatory Visit
Admission: RE | Admit: 2019-11-07 | Discharge: 2019-11-07 | Disposition: A | Discharge status: Home / Self Care | Payer: No Typology Code available for payment source | Source: Ambulatory Visit | Attending: Orthopedic Surgery | Admitting: Orthopedic Surgery

## 2019-11-07 DIAGNOSIS — M79672 Pain in left foot: Secondary | ICD-10-CM

## 2021-10-04 IMAGING — CT CT FOOT*L* W/O CM
4 of 7 series · 16 of 36 positions shown, 17 images · non-contrast
Comparison: None.

CLINICAL DATA: Decreased range of motion, history of TMT
arthrodesis

EXAM:
CT OF THE LEFT FOOT WITHOUT CONTRAST
TECHNIQUE: Multidetector CT imaging of the left foot was performed according to
the standard protocol. Multiplanar CT image reconstructions were
also generated.

[Series 1006: cor bone · axial · 0.49mm/px · z∈[+628,+800]mm · 3 of 64 slices shown, 4 images]
[im 1/64  soft-tissue]
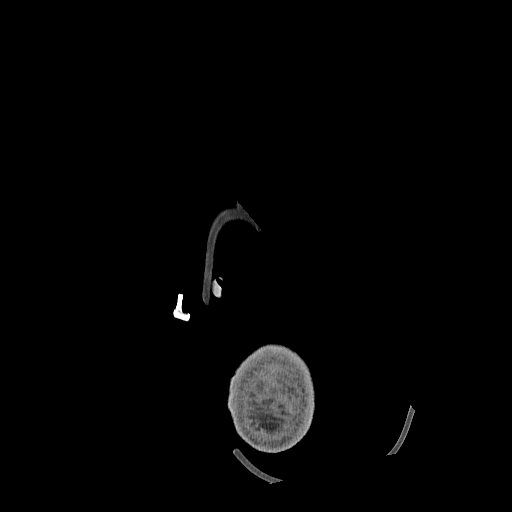
[im 1/64  bone]
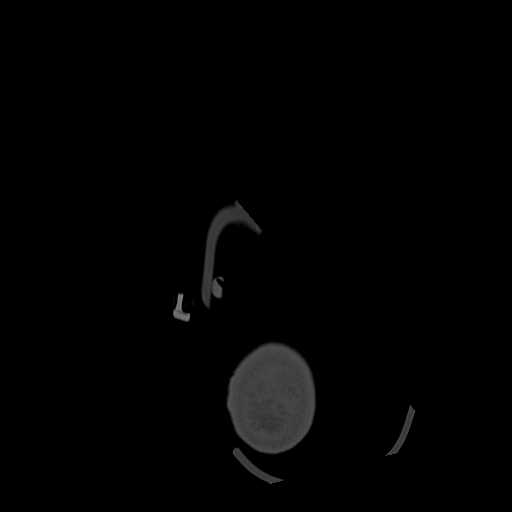
[im 32/64  bone]
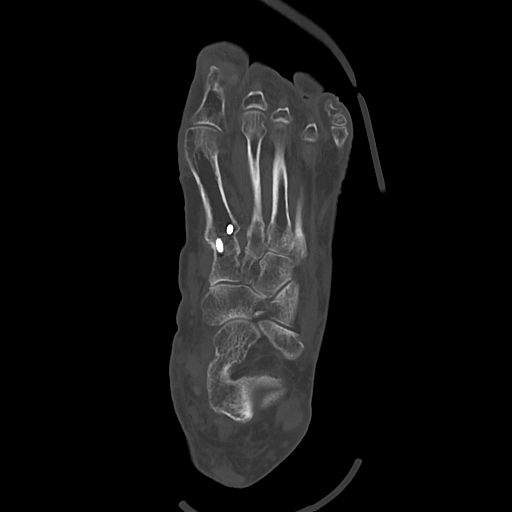
[im 64/64  bone]
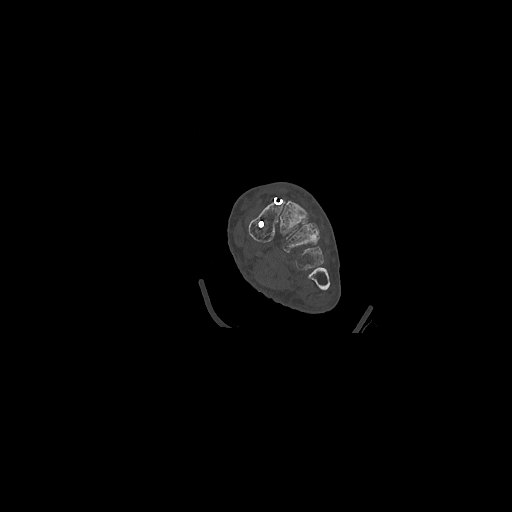

[Series 1008: axial bone · coronal · 0.49mm/px · 4 of 124 slices shown]
[im 25/124  bone]
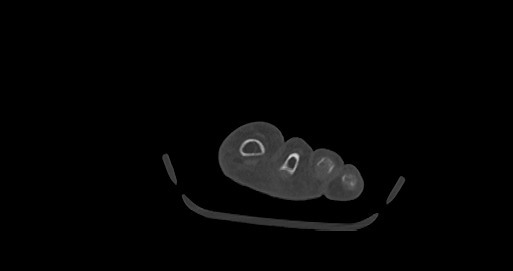
[im 50/124  bone]
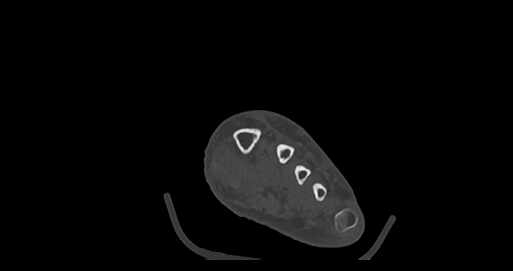
[im 74/124  bone]
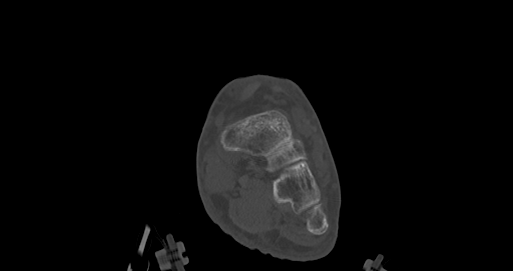
[im 99/124  bone]
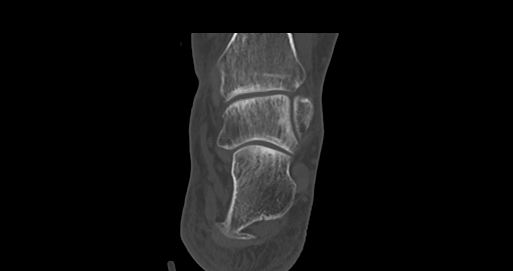

[Series 1010: sag bone · sagittal · 0.49mm/px · 6 of 127 slices shown]
[im 42/127  bone]
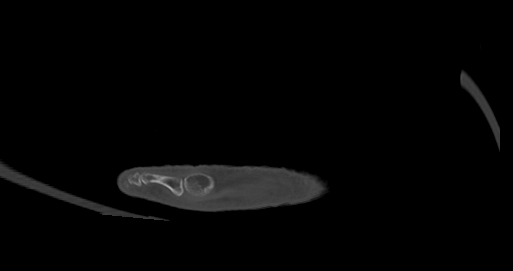
[im 53/127  bone]
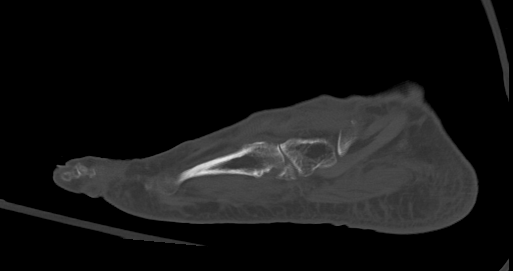
[im 59/127  soft-tissue]
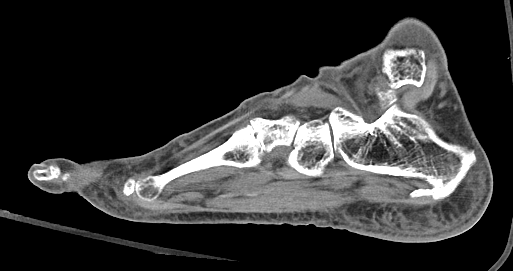
[im 64/127  bone]
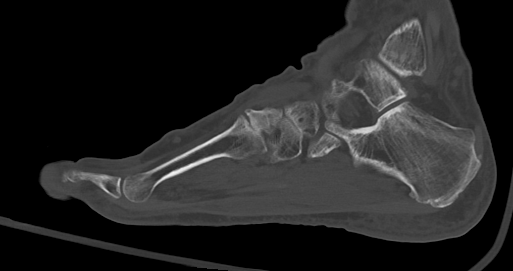
[im 75/127  bone]
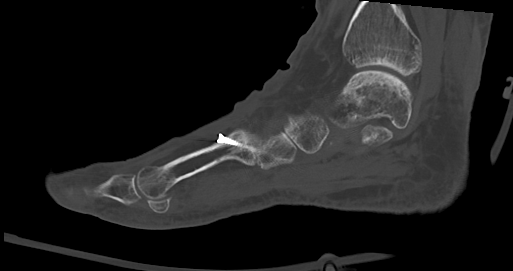
[im 86/127  bone]
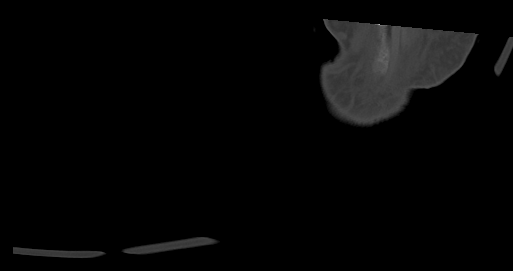

[Series 1018: axial soft · coronal · 0.49mm/px · 3 of 127 slices shown]
[im 43/127  bone]
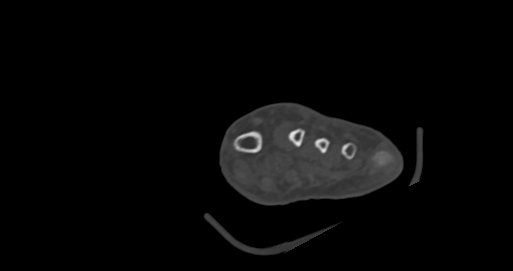
[im 57/127  bone]
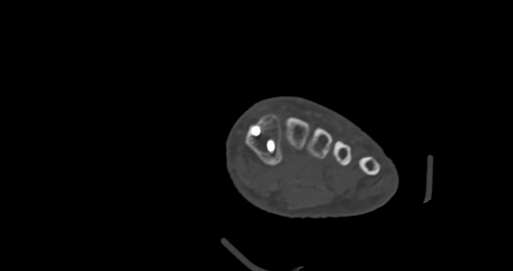
[im 71/127  bone]
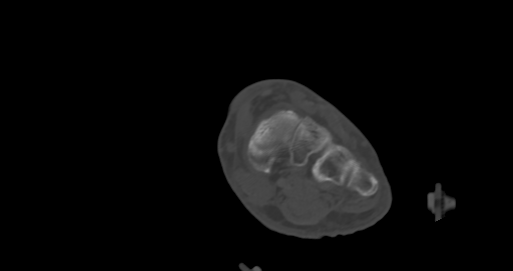

[16 of 36 positions shown; findings below may reference images not displayed]

FINDINGS: Bones/Joint/Cartilage

No fracture or dislocation. Normal alignment. No joint effusion. No
aggressive osseous lesion.

Arthrodesis of the first TMT joint transfixed with 2 cannulated
screws with solid osseous bridging. Arthrodesis of the second
tarsometatarsal joint with osseous fusion across the inferior half
of the joint.

Severe osteoarthritis of the third tarsometatarsal joint. Mild
osteoarthritis of the navicular-medial cuneiform. Mild
osteoarthritis of the first MTP joint. Hallux valgus of the left
foot.

Ligaments

Ligaments are suboptimally evaluated by CT.

Muscles and Tendons
Muscles are normal. Flexor, extensor and Achilles tendons are
intact. Mild tendinosis of the peroneus brevis.

Soft tissue
No fluid collection or hematoma.  No soft tissue mass.
IMPRESSION: 1. Solid osseous fusion across the first tarsometatarsal joint.
Osseous fusion along the inferior half of the second tarsometatarsal
joint.
2. Severe osteoarthritis of the third tarsometatarsal joint without
osseous fusion across the TMT joint.
3. Mild tendinosis of the peroneus brevis.
